# Patient Record
Sex: Male | Born: 2000 | Race: White | Hispanic: No | Marital: Single | Smoking: Never smoker
Health system: Southern US, Community
[De-identification: ages and names within clinical notes are randomized; demographics above are authoritative.]

---
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
## 2021-10-18 ENCOUNTER — Ambulatory Visit: Payer: Self-pay

## 2021-10-18 ENCOUNTER — Other Ambulatory Visit: Payer: Self-pay

## 2021-10-18 ENCOUNTER — Ambulatory Visit: Payer: Managed Care, Other (non HMO)

## 2021-10-18 DIAGNOSIS — Z0000 Encounter for general adult medical examination without abnormal findings: Secondary | ICD-10-CM

## 2021-10-18 LAB — POCT URINALYSIS DIPSTICK
Bilirubin, UA: NEGATIVE
Blood, UA: NEGATIVE
Glucose, UA: NEGATIVE
Ketones, UA: NEGATIVE
Leukocytes, UA: NEGATIVE
Nitrite, UA: NEGATIVE
Protein, UA: NEGATIVE
Spec Grav, UA: 1.01 (ref 1.010–1.025)
Urobilinogen, UA: 0.2 E.U./dL
pH, UA: 6.5 (ref 5.0–8.0)

## 2021-10-18 NOTE — Progress Notes (Signed)
Pt completed labs for scheduled physical.  ? 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 ?

## 2021-10-19 LAB — CMP12+LP+TP+TSH+6AC+CBC/D/PLT
ALT: 30 IU/L (ref 0–44)
AST: 34 IU/L (ref 0–40)
Albumin/Globulin Ratio: 2 (ref 1.2–2.2)
Albumin: 4.9 g/dL (ref 4.1–5.2)
Alkaline Phosphatase: 82 IU/L (ref 51–125)
BUN/Creatinine Ratio: 9 (ref 9–20)
BUN: 12 mg/dL (ref 6–20)
Basophils Absolute: 0.1 10*3/uL (ref 0.0–0.2)
Basos: 1 %
Bilirubin Total: 0.9 mg/dL (ref 0.0–1.2)
Calcium: 10 mg/dL (ref 8.7–10.2)
Chloride: 98 mmol/L (ref 96–106)
Chol/HDL Ratio: 3.1 ratio (ref 0.0–5.0)
Cholesterol, Total: 136 mg/dL (ref 100–199)
Creatinine, Ser: 1.33 mg/dL — ABNORMAL HIGH (ref 0.76–1.27)
EOS (ABSOLUTE): 0.1 10*3/uL (ref 0.0–0.4)
Eos: 1 %
Estimated CHD Risk: <0.5 times avg. (ref 0.0–1.0)
Free Thyroxine Index: 1.9 (ref 1.2–4.9)
GGT: 10 IU/L (ref 0–65)
Globulin, Total: 2.4 g/dL (ref 1.5–4.5)
Glucose: 86 mg/dL (ref 70–99)
HDL: 44 mg/dL (ref >39)
Hematocrit: 49.4 % (ref 37.5–51.0)
Hemoglobin: 16.3 g/dL (ref 13.0–17.7)
Immature Grans (Abs): 0 10*3/uL (ref 0.0–0.1)
Immature Granulocytes: 0 %
Iron: 158 ug/dL (ref 38–169)
LDH: 154 IU/L (ref 121–224)
LDL Chol Calc (NIH): 79 mg/dL (ref 0–99)
Lymphocytes Absolute: 1.2 10*3/uL (ref 0.7–3.1)
Lymphs: 21 %
MCH: 29.7 pg (ref 26.6–33.0)
MCHC: 33 g/dL (ref 31.5–35.7)
MCV: 90 fL (ref 79–97)
Monocytes Absolute: 0.5 10*3/uL (ref 0.1–0.9)
Monocytes: 10 %
Neutrophils Absolute: 3.6 10*3/uL (ref 1.4–7.0)
Neutrophils: 67 %
Phosphorus: 3.9 mg/dL (ref 2.8–4.1)
Platelets: 230 10*3/uL (ref 150–450)
Potassium: 4.5 mmol/L (ref 3.5–5.2)
RBC: 5.49 x10E6/uL (ref 4.14–5.80)
RDW: 12.3 % (ref 11.6–15.4)
Sodium: 138 mmol/L (ref 134–144)
T3 Uptake Ratio: 28 % (ref 24–39)
T4, Total: 6.8 ug/dL (ref 4.5–12.0)
TSH: 3.16 u[IU]/mL (ref 0.450–4.500)
Total Protein: 7.3 g/dL (ref 6.0–8.5)
Triglycerides: 60 mg/dL (ref 0–149)
Uric Acid: 5.5 mg/dL (ref 3.8–8.4)
VLDL Cholesterol Cal: 13 mg/dL (ref 5–40)
WBC: 5.5 10*3/uL (ref 3.4–10.8)
eGFR: 78 mL/min/{1.73_m2} (ref >59)

## 2021-10-26 ENCOUNTER — Ambulatory Visit: Payer: Self-pay

## 2021-10-26 ENCOUNTER — Encounter: Payer: Self-pay | Admitting: Physician Assistant

## 2021-10-26 ENCOUNTER — Ambulatory Visit: Payer: Self-pay | Admitting: Physician Assistant

## 2021-10-26 VITALS — BP 130/86 | HR 70 | Temp 98.0°F | Resp 12 | Ht 75.0 in

## 2021-10-26 DIAGNOSIS — Z0000 Encounter for general adult medical examination without abnormal findings: Secondary | ICD-10-CM

## 2021-10-26 DIAGNOSIS — Z0289 Encounter for other administrative examinations: Secondary | ICD-10-CM

## 2021-10-26 NOTE — Progress Notes (Signed)
Pt presents today day to complete physical. ?

## 2021-10-26 NOTE — Progress Notes (Signed)
? ?[facility] ? ?____________________________________________ ? ? None  ?  (approximate) ? ?I have reviewed the triage vital signs and the nursing notes. ? ? ?HISTORY ? 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 ? 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  Patient voices no concerns or complaints. ?   ? ?  ? ?History reviewed. No pertinent past medical history. ? ?There are no problems to display for this patient. ? ? ?History reviewed. No pertinent surgical history. ? ?Prior to Admission medications   ?Not on File  ? ? ?Allergies ?Patient has no known allergies. ? ?Family History  ?Problem Relation Age of Onset  ? Hypertension Father   ? Hypertension Maternal Grandfather   ? Hypertension Paternal Grandfather   ? ? ?Social History ?Social History  ? ?Tobacco Use  ? Smoking status: Never  ? Smokeless tobacco: Never  ? ? ?Review of Systems ?Constitutional: No fever/chills ?Eyes: No visual changes. ?ENT: No sore throat. ?Cardiovascular: Denies chest pain. ?Respiratory: Denies shortness of breath. ?Gastrointestinal: No abdominal pain.  No nausea, no vomiting.  No diarrhea.  No constipation. ?Genitourinary: Negative for dysuria. ?Musculoskeletal: Negative for back pain. ?Skin: Negative for rash. ?Neurological: Negative for headaches, focal weakness or numbness. ? ?____________________________________________ ? ? ?PHYSICAL EXAM: ? ?VITAL SIGNS: Temperature 98, pulse 70, respiration 12, BP is 130/86, patient is 98% O2 sat on room air.  Patient weighs 195 pounds. ?Constitutional: Alert and oriented. Well appearing and in no acute distress. ?Eyes: Conjunctivae are normal. PERRL. EOMI. ?Head: Atraumatic. ?Nose: No congestion/rhinnorhea. ?Mouth/Throat: Mucous membranes are moist.  Oropharynx non-erythematous. ?Neck: No stridor.  No cervical spine tenderness to palpation. ?Hematological/Lymphatic/Immunilogical: No cervical  lymphadenopathy. ?Cardiovascular: Normal rate, regular rhythm. Grossly normal heart sounds.  Good peripheral circulation. ?Respiratory: Normal respiratory effort.  No retractions. Lungs CTAB. ?Gastrointestinal: Soft and nontender. No distention. No abdominal bruits. No CVA tenderness. ?Genitourinary: Deferred ?Musculoskeletal: No lower extremity tenderness nor edema.  No joint effusions. ?Neurologic:  Normal speech and language. No gross focal neurologic deficits are appreciated. No gait instability. ?Skin:  Skin is warm, dry and intact. No rash noted. ?Psychiatric: Mood and affect are normal. Speech and behavior are normal. ? ?____________________________________________ ?  ?LABS ?       ?Component Ref Range & Units 8 d ago ?(10/18/21) 1 yr ago ?(10/25/20) 1 yr ago ?(10/19/20) 1 yr ago ?(03/30/20)  ?Glucose 70 - 99 mg/dL 86   92 R  68 R   ?Uric Acid 3.8 - 8.4 mg/dL 5.5   5.6 CM  5.5 CM   ?Comment:            Therapeutic target for gout patients: <6.0  ?BUN 6 - 20 mg/dL _0 ?Creatinine, Ser 0.76 - 1.27 mg/dL 1.33 High    1.11  1.03   ?eGFR >59 mL/min/1.73 78   98    ?BUN/Creatinine Ratio 9 - _1 ?Sodium 134 - 144 mmol/L 138   141  138   ?Potassium 3.5 - 5.2 mmol/L 4.5   4.5  5.0   ?Chloride 96 - 106 mmol/L 98   103  101   ?Calcium 8.7 - 10.2 mg/dL 10.0   9.4  9.5   ?Phosphorus 2.8 - 4.1 mg/dL 3.9   3.8 R  3.8 R   ?Total Protein 6.0 - 8.5 g/dL 7.3   6.9  7.2   ?  Albumin 4.1 - 5.2 g/dL 4.9   4.7  4.9   ?Globulin, Total 1.5 - 4.5 g/dL 2.4   2.2  2.3   ?Albumin/Globulin Ratio 1.2 - 2.2 2.0   2.1  2.1   ?Bilirubin Total 0.0 - 1.2 mg/dL 0.9   0.4  0.7   ?Alkaline Phosphatase 51 - 125 IU/L 82   100  88 R, CM   ?LDH 121 - 224 IU/L 154   199  206   ?AST 0 - 40 IU/L 34   79 High   48 High    ?ALT 0 - 44 IU/L 30   43  39   ?GGT 0 - 65 IU/L _0 ?Iron 38 - 169 ug/dL 158   93  105   ?Cholesterol, Total 100 - 199 mg/dL 136   125 R  130 R   ?Triglycerides 0 - 149 mg/dL 60   64 R  115 High  R   ?HDL >39  mg/dL 44   41  42   ?VLDL Cholesterol Cal 5 - 40 mg/dL _1 ?LDL Chol Calc (NIH) 0 - 99 mg/dL 79   70 R  67 R   ?Chol/HDL Ratio 0.0 - 5.0 ratio 3.1   3.0 CM  3.1 CM   ?Comment:                                   T. Chol/HDL Ratio  ?                                            Men  Women  ?                              1/2 Avg.Risk  3.4    3.3  ?                                  Avg.Risk  5.0    4.4  ?                               2X Avg.Risk  9.6    7.1  ?                               3X Avg.Risk 23.4   11.0   ?Estimated CHD Risk 0.0 - 1.0 times avg.  < 0.5    < 0.5 R, CM   < 0.5 R, CM   ?Comment: The CHD Risk is based on the T. Chol/HDL ratio. Other  ?factors affect CHD Risk such as hypertension, smoking,  ?diabetes, severe obesity, and family history of  ?premature CHD.   ?TSH 0.450 - 4.500 uIU/mL 3.160  2.570  4.620 High   2.230   ?T4, Total 4.5 - 12.0 ug/dL 6.8  7.4  6.3  6.6   ?T3 Uptake Ratio 24 - 39 % _2 R   ?Free Thyroxine Index 1.2 - 4.9 1.9  2.1  1.6  2.0   ?WBC 3.4 - 10.8 x10E3/uL 5.5   4.8  9.2   ?RBC 4.14 - 5.80 x10E6/uL 5.49   5.16  5.31   ?Hemoglobin 13.0 - 17.7 g/dL 16.3   15.3  16.4   ?Hematocrit 37.5 - 51.0 % 49.4   46.2  47.4   ?MCV 79 - 97 fL 90   90  89   ?MCH 26.6 - 33.0 pg 29.7   29.7  30.9   ?MCHC 31.5 - 35.7 g/dL 33.0   33.1  34.6   ?RDW 11.6 - 15.4 % 12.3   12.6  12.0   ?Platelets 150 - 450 x10E3/uL 230   204  233   ?Neutrophils Not Estab. % 67   61  73   ?Lymphs Not Estab. % _0 ?Monocytes Not Estab. % _1 ?Eos Not Estab. % _2 ?Basos Not Estab. % _3 ?Neutrophils Absolute 1.4 - 7.0 x10E3/uL 3.6   2.9  6.7   ?Lymphocytes Absolute 0.7 - 3.1 x10E3/uL 1.2   1.3  1.5   ?Monocytes Absolute 0.1 - 0.9 x10E3/uL 0.5   0.4  0.7   ?EOS (ABSOLUTE) 0.0 - 0.4 x10E3/uL 0.1   0.1  0.1   ?Basophils Absolute 0.0 - 0.2 x10E3/uL 0.1   0.1  0.1   ?Immature Granulocytes Not Estab. % 0   0  0   ?Immature Grans (Abs) 0.0 - 0.1 x10E3/uL 0.0   0.0  0.0    ?Resulting Agency  [facility]  ?  ? ?  ?Narrative ?Performed by: Maryan Puls ?Performed at:  [facility]  ?[address], [place], [place]  [id number]  ?Lab Director: Rush Farmer MD, Phone:  [phone]  ?  ?Specimen Collected: 10/18/21 09:32 Last Resulted: 10/19/21 08:14  ?  ?  Lab Flowsheet   ? Order Details   ? View Encounter   ? Lab and Collection Details   ? Routing   ? Result History    ?View Encounter Conversation    ?  ?CM=Additional comments  R=Reference range differs from displayed range    ?  ?Result Care Coordination ? ? ?Patient Communication ? ? Add Comments   Not seen Back to Top  ?  ?  ? ?Other Results from 10/18/2021 ? ? ?POCT urinalysis dipstick ?Order: [id number] ?Status: Final result    ?Visible to patient: No (inaccessible in MyChart)    ?Next appt: None    ?Dx: Annual physical exam    ?0 Result Notes ?      ?Component Ref Range & Units 8 d ago ?(10/18/21) 1 yr ago ?(10/19/20) 1 yr ago ?(03/30/20)  ?Color, UA  yellow  dark yellow  LIGHT YELLOW   ?Clarity, UA  clear  cloudy  CLEAR   ?Glucose, UA Negative Negative  Negative  Negative   ?Bilirubin, UA  negative  negative  NEGATIVE   ?Ketones, UA  negative  negative  NEGATIVE   ?Spec Grav, UA 1.010 - 1.025 1.010  1.020  1.010   ?Blood, UA  negative  negative  NEGATIVE   ?pH, UA 5.0 - 8.0 6.5  6.0  6.0   ?Protein, UA Negative Negative  Positive Abnormal  CM  Negative   ?Urobilinogen, UA 0.2 or 1.0 E.U./dL 0.2  0.2  0.2   ?Nitrite, UA  negative  negative  NEGATIVE   ?Leukocytes, UA Negative Negative  Negative  Negative   ?Appearance   dark  LIGHT   ?      ?  ? ?____________________________________________ ? ?EKG ? ?Atrial  Rhythm  ?P:QRS - 1:1, Abnormal P axis, H Rate 69 ?-RSR(V1) -probably normal for age.  ? ?PROBABLY NORMAL FOR AGE ?____________________________________________ ? ? ? ?____________________________________________ ? ? ?INITIAL IMPRESSION / ASSESSMENT AND PLAN  ?As part of my medical decision making, I reviewed  the following data within the [facility]  ? ?   ?Discussed lab and EKG results with patient. ?  ? ? ? ? ? ?____________________________________________ ? ? ?FINAL CLINICAL IMPRESSION( ? ?Well exam ? ? ?

## 2021-10-29 DIAGNOSIS — S01112A Laceration without foreign body of left eyelid and periocular area, initial encounter: Secondary | ICD-10-CM | POA: Diagnosis not present

## 2021-10-29 DIAGNOSIS — H26112 Localized traumatic opacities, left eye: Secondary | ICD-10-CM | POA: Diagnosis not present

## 2021-10-30 ENCOUNTER — Ambulatory Visit: Payer: Self-pay | Admitting: Physician Assistant

## 2021-10-30 ENCOUNTER — Encounter: Payer: Self-pay | Admitting: Physician Assistant

## 2021-10-30 VITALS — BP 130/80 | HR 91 | Temp 98.7°F | Resp 16

## 2021-10-30 DIAGNOSIS — S01112A Laceration without foreign body of left eyelid and periocular area, initial encounter: Secondary | ICD-10-CM
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
## 2021-11-01 DIAGNOSIS — S01112A Laceration without foreign body of left eyelid and periocular area, initial encounter: Secondary | ICD-10-CM | POA: Diagnosis not present

## 2021-12-05 DIAGNOSIS — S0502XA Injury of conjunctiva and corneal abrasion without foreign body, left eye, initial encounter: Secondary | ICD-10-CM | POA: Diagnosis not present

## 2022-09-17 NOTE — Progress Notes (Unsigned)
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

## 2022-09-19 ENCOUNTER — Ambulatory Visit: Payer: Self-pay

## 2022-09-19 DIAGNOSIS — Z0000 Encounter for general adult medical examination without abnormal findings: Secondary | ICD-10-CM

## 2022-09-19 LAB — POCT URINALYSIS DIPSTICK
Bilirubin, UA: NEGATIVE
Blood, UA: NEGATIVE
Glucose, UA: NEGATIVE
Ketones, UA: NEGATIVE
Leukocytes, UA: NEGATIVE
Nitrite, UA: NEGATIVE
Protein, UA: NEGATIVE
Spec Grav, UA: 1.015 (ref 1.010–1.025)
Urobilinogen, UA: 0.2 E.U./dL
pH, UA: 6 (ref 5.0–8.0)

## 2022-09-20 LAB — CMP12+LP+TP+TSH+6AC+CBC/D/PLT
ALT: 25 IU/L (ref 0–44)
AST: 41 IU/L — ABNORMAL HIGH (ref 0–40)
Albumin/Globulin Ratio: 2.1 (ref 1.2–2.2)
Albumin: 4.7 g/dL (ref 4.3–5.2)
Alkaline Phosphatase: 69 IU/L (ref 51–125)
BUN/Creatinine Ratio: 14 (ref 9–20)
BUN: 14 mg/dL (ref 6–20)
Basophils Absolute: 0.1 10*3/uL (ref 0.0–0.2)
Basos: 1 %
Bilirubin Total: 0.8 mg/dL (ref 0.0–1.2)
Calcium: 9.5 mg/dL (ref 8.7–10.2)
Chloride: 101 mmol/L (ref 96–106)
Chol/HDL Ratio: 3.2 ratio (ref 0.0–5.0)
Cholesterol, Total: 148 mg/dL (ref 100–199)
Creatinine, Ser: 1.01 mg/dL (ref 0.76–1.27)
EOS (ABSOLUTE): 0.3 10*3/uL (ref 0.0–0.4)
Eos: 5 %
Estimated CHD Risk: <0.5 times avg. (ref 0.0–1.0)
Free Thyroxine Index: 2.1 (ref 1.2–4.9)
GGT: 12 IU/L (ref 0–65)
Globulin, Total: 2.2 g/dL (ref 1.5–4.5)
Glucose: 91 mg/dL (ref 70–99)
HDL: 46 mg/dL (ref >39)
Hematocrit: 48.1 % (ref 37.5–51.0)
Hemoglobin: 15.8 g/dL (ref 13.0–17.7)
Immature Grans (Abs): 0 10*3/uL (ref 0.0–0.1)
Immature Granulocytes: 0 %
Iron: 119 ug/dL (ref 38–169)
LDH: 169 IU/L (ref 121–224)
LDL Chol Calc (NIH): 80 mg/dL (ref 0–99)
Lymphocytes Absolute: 1.6 10*3/uL (ref 0.7–3.1)
Lymphs: 25 %
MCH: 29.6 pg (ref 26.6–33.0)
MCHC: 32.8 g/dL (ref 31.5–35.7)
MCV: 90 fL (ref 79–97)
Monocytes Absolute: 0.5 10*3/uL (ref 0.1–0.9)
Monocytes: 8 %
Neutrophils Absolute: 3.9 10*3/uL (ref 1.4–7.0)
Neutrophils: 61 %
Phosphorus: 4.4 mg/dL — ABNORMAL HIGH (ref 2.8–4.1)
Platelets: 210 10*3/uL (ref 150–450)
Potassium: 4.1 mmol/L (ref 3.5–5.2)
RBC: 5.33 x10E6/uL (ref 4.14–5.80)
RDW: 12.6 % (ref 11.6–15.4)
Sodium: 140 mmol/L (ref 134–144)
T3 Uptake Ratio: 28 % (ref 24–39)
T4, Total: 7.6 ug/dL (ref 4.5–12.0)
TSH: 2.72 u[IU]/mL (ref 0.450–4.500)
Total Protein: 6.9 g/dL (ref 6.0–8.5)
Triglycerides: 125 mg/dL (ref 0–149)
Uric Acid: 5.5 mg/dL (ref 3.8–8.4)
VLDL Cholesterol Cal: 22 mg/dL (ref 5–40)
WBC: 6.4 10*3/uL (ref 3.4–10.8)
eGFR: 109 mL/min/{1.73_m2} (ref >59)

## 2022-09-25 ENCOUNTER — Encounter: Payer: Managed Care, Other (non HMO) | Admitting: Physician Assistant

## 2022-09-27 ENCOUNTER — Encounter: Payer: Self-pay | Admitting: Physician Assistant

## 2022-09-27 ENCOUNTER — Ambulatory Visit: Payer: Self-pay | Admitting: Physician Assistant

## 2022-09-27 VITALS — Temp 97.6°F | Resp 12 | Ht 75.0 in | Wt 191.0 lb

## 2022-09-27 DIAGNOSIS — Z23 Encounter for immunization: Secondary | ICD-10-CM

## 2022-09-27 DIAGNOSIS — Z0000 Encounter for general adult medical examination without abnormal findings: Secondary | ICD-10-CM

## 2022-09-27 NOTE — Addendum Note (Signed)
Addended by: Malachy Moan F on: 09/27/2022 11:38 AM   Modules accepted: Orders

## 2022-09-27 NOTE — Progress Notes (Signed)
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

## 2022-09-27 NOTE — Progress Notes (Signed)
[facility]  ____________________________________________   None    (approximate)  I have reviewed the triage vital signs and the nursing notes.   HISTORY  Chief Complaint No chief complaint on file.   HPI Neil Robinson is a 22 y.o. male patient presents for annual physical exam.  Patient voiced no concerns or complaints.        No past medical history on file.  There are no problems to display for this patient.   No past surgical history on file.  Prior to Admission medications   Not on File    Allergies Patient has no known allergies.  Family History  Problem Relation Age of Onset   Hypertension Father    Hypertension Maternal Grandfather    Hypertension Paternal Grandfather     Social History Social History   Tobacco Use   Smoking status: Never   Smokeless tobacco: Never    Review of Systems Constitutional: No fever/chills Eyes: No visual changes. ENT: No sore throat. Cardiovascular: Denies chest pain. Respiratory: Denies shortness of breath. Gastrointestinal: No abdominal pain.  No nausea, no vomiting.  No diarrhea.  No constipation. Genitourinary: Negative for dysuria. Musculoskeletal: Negative for back pain. Skin: Negative for rash. Neurological: Negative for headaches, focal weakness or numbness.  ____________________________________________   PHYSICAL EXAM:  VITAL SIGNS: BP is 131/78, respiration 12, pulse 99, and patient is 99% O2 sat on room air.  Patient weighs 191 pounds and BMI is 23.87. Constitutional: Alert and oriented. Well appearing and in no acute distress. Eyes: Conjunctivae are normal. PERRL. EOMI. Head: Atraumatic. Nose: No congestion/rhinnorhea. Mouth/Throat: Mucous membranes are moist.  Oropharynx non-erythematous. Neck: No stridor. No cervical spine tenderness to palpation. Hematological/Lymphatic/Immunilogical: No cervical lymphadenopathy. Cardiovascular: Normal rate, regular  rhythm. Grossly normal heart sounds.  Good peripheral circulation. Respiratory: Normal respiratory effort.  No retractions. Lungs CTAB. Gastrointestinal: Soft and nontender. No distention. No abdominal bruits. No CVA tenderness. Genitourinary: Deferred Ros musculoskeletal: No lower extremity tenderness nor edema.  No joint effusions. Neurologic:  Normal speech and language. No gross focal neurologic deficits are appreciated. No gait instability. Skin:  Skin is warm, dry and intact. No rash noted. Psychiatric: Mood and affect are normal. Speech and behavior are normal.  ____________________________________________   LABS         Component Ref Range & Units 8 d ago 11 mo ago 1 yr ago 2 yr ago  Color, UA  yellow yellow dark yellow LIGHT YELLOW  Clarity, UA  clear clear cloudy CLEAR  Glucose, UA Negative Negative Negative Negative Negative  Bilirubin, UA  neg negative negative NEGATIVE  Ketones, UA  neg negative negative NEGATIVE  Spec Grav, UA 1.010 - 1.025 1.015 1.010 1.020 1.010  Blood, UA  neg negative negative NEGATIVE  pH, UA 5.0 - 8.0 6.0 6.5 6.0 6.0  Protein, UA Negative Negative Negative Positive Abnormal  CM Negative  Urobilinogen, UA 0.2 or 1.0 E.U./dL 0.2 0.2 0.2 0.2  Nitrite, UA  neg negative negative NEGATIVE  Leukocytes, UA Negative Negative Negative Negative Negative  Appearance    dark LIGHT  Odor                    Component Ref Range & Units 8 d ago (09/19/22) 11 mo ago (10/18/21) 1 yr ago (10/25/20) 1 yr ago (10/19/20) 2 yr ago (03/30/20)  Glucose 70 - 99 mg/dL 91 86  92 R 68 R  Uric Acid 3.8 - 8.4 mg/dL 5.5 5.5 CM  5.6 CM 5.5 CM  Comment:            Therapeutic target for gout patients: <6.0  BUN 6 - 20 mg/dL '14 12  17 13  '$ Creatinine, Ser 0.76 - 1.27 mg/dL 1.01 1.33 High   1.11 1.03  eGFR >59 mL/min/1.73 109 78  98   BUN/Creatinine Ratio 9 - '20 14 9  15 13  '$ Sodium 134 - 144 mmol/L 140 138  141 138  Potassium 3.5 - 5.2 mmol/L 4.1 4.5  4.5 5.0  Chloride 96 -  106 mmol/L 101 98  103 101  Calcium 8.7 - 10.2 mg/dL 9.5 10.0  9.4 9.5  Phosphorus 2.8 - 4.1 mg/dL 4.4 High  3.9  3.8 R 3.8 R  Total Protein 6.0 - 8.5 g/dL 6.9 7.3  6.9 7.2  Albumin 4.3 - 5.2 g/dL 4.7 4.9 R  4.7 R 4.9 R  Globulin, Total 1.5 - 4.5 g/dL 2.2 2.4  2.2 2.3  Albumin/Globulin Ratio 1.2 - 2.2 2.1 2.0  2.1 2.1  Bilirubin Total 0.0 - 1.2 mg/dL 0.8 0.9  0.4 0.7  Alkaline Phosphatase 51 - 125 IU/L 69 82  100 88 R, CM  LDH 121 - 224 IU/L 169 154  199 206  AST 0 - 40 IU/L 41 High  34  79 High  48 High   ALT 0 - 44 IU/L 25 30  43 39  GGT 0 - 65 IU/L '12 10  10 12  '$ Iron 38 - 169 ug/dL 119 158  93 105  Cholesterol, Total 100 - 199 mg/dL 148 136  125 R 130 R  Triglycerides 0 - 149 mg/dL 125 60  64 R 115 High  R  HDL >39 mg/dL 46 44  41 42  VLDL Cholesterol Cal 5 - 40 mg/dL '22 13  14 21  '$ LDL Chol Calc (NIH) 0 - 99 mg/dL 80 79  70 R 67 R  Chol/HDL Ratio 0.0 - 5.0 ratio 3.2 3.1 CM  3.0 CM 3.1 CM  Comment:                                   T. Chol/HDL Ratio                                             Men  Women                               1/2 Avg.Risk  3.4    3.3                                   Avg.Risk  5.0    4.4                                2X Avg.Risk  9.6    7.1                                3X Avg.Risk 23.4   11.0  Estimated CHD Risk 0.0 - 1.0 times avg.  < 0.5  <  0.5 CM   < 0.5 R, CM  < 0.5 R, CM  Comment: The CHD Risk is based on the T. Chol/HDL ratio. Other factors affect CHD Risk such as hypertension, smoking, diabetes, severe obesity, and family history of premature CHD.  TSH 0.450 - 4.500 uIU/mL 2.720 3.160 2.570 4.620 High  2.230  T4, Total 4.5 - 12.0 ug/dL 7.6 6.8 7.4 6.3 6.6  T3 Uptake Ratio 24 - 39 % '28 28 29 25 31 '$ R  Free Thyroxine Index 1.2 - 4.9 2.1 1.9 2.1 1.6 2.0  WBC 3.4 - 10.8 x10E3/uL 6.4 5.5  4.8 9.2  RBC 4.14 - 5.80 x10E6/uL 5.33 5.49  5.16 5.31  Hemoglobin 13.0 - 17.7 g/dL 15.8 16.3  15.3 16.4  Hematocrit 37.5 - 51.0 % 48.1 49.4  46.2 47.4  MCV 79 -  97 fL 90 90  90 89  MCH 26.6 - 33.0 pg 29.6 29.7  29.7 30.9  MCHC 31.5 - 35.7 g/dL 32.8 33.0  33.1 34.6  RDW 11.6 - 15.4 % 12.6 12.3  12.6 12.0  Platelets 150 - 450 x10E3/uL 210 230  204 233  Neutrophils Not Estab. % 61 67  61 73  Lymphs Not Estab. % '25 21  28 17  '$ Monocytes Not Estab. % '8 10  8 8  '$ Eos Not Estab. % '5 1  2 1  '$ Basos Not Estab. % '1 1  1 1  '$ Neutrophils Absolute 1.4 - 7.0 x10E3/uL 3.9 3.6  2.9 6.7  Lymphocytes Absolute 0.7 - 3.1 x10E3/uL 1.6 1.2  1.3 1.5  Monocytes Absolute 0.1 - 0.9 x10E3/uL 0.5 0.5  0.4 0.7  EOS (ABSOLUTE) 0.0 - 0.4 x10E3/uL 0.3 0.1  0.1 0.1  Basophils Absolute 0.0 - 0.2 x10E3/uL 0.1 0.1  0.1 0.1  Immature Granulocytes Not Estab. % 0 0  0 0              _____________________________  EKG Atrial rhythm at 69 bpm.  RSR is seen in V1.  ____________________________________________    ____________________________________________   INITIAL IMPRESSION / ASSESSMENT AND PLAN   As part of my medical decision making, I reviewed the following data within the electronic MEDICAL RECORD NUMBER       Discussed physical exam and EKG findings with patient.  Patient was found to have bilateral Sever impaction which was removed secondary irrigated prior to departure.     ____________________________________________   FINAL CLINICAL IMPRESSION Well exam   ED Discharge Orders     None        Note:  This document was prepared using Dragon voice recognition software and may include unintentional dictation errors.

## 2022-09-27 NOTE — Progress Notes (Signed)
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

## 2022-12-07 ENCOUNTER — Other Ambulatory Visit: Payer: Self-pay

## 2022-12-07 DIAGNOSIS — Z0283 Encounter for blood-alcohol and blood-drug test: Secondary | ICD-10-CM

## 2022-12-07 NOTE — Progress Notes (Signed)
Completed random urine drug screen and BAT for COB.

## 2023-10-31 ENCOUNTER — Ambulatory Visit: Payer: Self-pay | Admitting: Physician Assistant

## 2023-10-31 DIAGNOSIS — H6123 Impacted cerumen, bilateral: Secondary | ICD-10-CM
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
## 2023-12-24 ENCOUNTER — Encounter: Admitting: Physician Assistant

## 2024-04-27 DIAGNOSIS — L0591 Pilonidal cyst without abscess: Secondary | ICD-10-CM | POA: Diagnosis not present

## 2024-05-26 DIAGNOSIS — S76012A Strain of muscle, fascia and tendon of left hip, initial encounter: Secondary | ICD-10-CM | POA: Diagnosis not present

## 2024-06-05 DIAGNOSIS — G5702 Lesion of sciatic nerve, left lower limb: Secondary | ICD-10-CM | POA: Diagnosis not present
